# Patient Record
Sex: Female | Born: 1975 | Hispanic: Yes | Marital: Single | Smoking: Never smoker
Health system: Southern US, Community
[De-identification: ages and names within clinical notes are randomized; demographics above are authoritative.]

## PROBLEM LIST (undated history)

## (undated) DIAGNOSIS — K259 Gastric ulcer, unspecified as acute or chronic, without hemorrhage or perforation: Secondary | ICD-10-CM

---

## 2020-03-31 ENCOUNTER — Emergency Department (HOSPITAL_COMMUNITY): Payer: Self-pay

## 2020-03-31 ENCOUNTER — Other Ambulatory Visit: Payer: Self-pay

## 2020-03-31 ENCOUNTER — Encounter (HOSPITAL_COMMUNITY): Payer: Self-pay

## 2020-03-31 ENCOUNTER — Emergency Department (HOSPITAL_COMMUNITY)
Admission: EM | Admit: 2020-03-31 | Discharge: 2020-03-31 | Disposition: A | Discharge status: Home / Self Care | Payer: Self-pay | Attending: Emergency Medicine | Admitting: Emergency Medicine

## 2020-03-31 DIAGNOSIS — N390 Urinary tract infection, site not specified: Secondary | ICD-10-CM | POA: Insufficient documentation

## 2020-03-31 DIAGNOSIS — R1084 Generalized abdominal pain: Secondary | ICD-10-CM

## 2020-03-31 HISTORY — DX: Gastric ulcer, unspecified as acute or chronic, without hemorrhage or perforation: K25.9

## 2020-03-31 LAB — URINALYSIS, ROUTINE W REFLEX MICROSCOPIC
Bilirubin Urine: NEGATIVE
Glucose, UA: NEGATIVE mg/dL
Hgb urine dipstick: NEGATIVE
Ketones, ur: NEGATIVE mg/dL
Nitrite: NEGATIVE
Protein, ur: NEGATIVE mg/dL
Specific Gravity, Urine: 1.006 (ref 1.005–1.030)
pH: 7 (ref 5.0–8.0)

## 2020-03-31 LAB — CBC WITH DIFFERENTIAL/PLATELET
Abs Immature Granulocytes: 0.02 10*3/uL (ref 0.00–0.07)
Basophils Absolute: 0.1 10*3/uL (ref 0.0–0.1)
Basophils Relative: 1 %
Eosinophils Absolute: 1.8 10*3/uL — ABNORMAL HIGH (ref 0.0–0.5)
Eosinophils Relative: 20 %
HCT: 39.8 % (ref 36.0–46.0)
Hemoglobin: 12.5 g/dL (ref 12.0–15.0)
Immature Granulocytes: 0 %
Lymphocytes Relative: 22 %
Lymphs Abs: 1.9 10*3/uL (ref 0.7–4.0)
MCH: 27.8 pg (ref 26.0–34.0)
MCHC: 31.4 g/dL (ref 30.0–36.0)
MCV: 88.6 fL (ref 80.0–100.0)
Monocytes Absolute: 0.3 10*3/uL (ref 0.1–1.0)
Monocytes Relative: 4 %
Neutro Abs: 4.8 10*3/uL (ref 1.7–7.7)
Neutrophils Relative %: 53 %
Platelets: 300 10*3/uL (ref 150–400)
RBC: 4.49 MIL/uL (ref 3.87–5.11)
RDW: 13.7 % (ref 11.5–15.5)
WBC: 8.9 10*3/uL (ref 4.0–10.5)
nRBC: 0 % (ref 0.0–0.2)

## 2020-03-31 LAB — COMPREHENSIVE METABOLIC PANEL
ALT: 20 U/L (ref 0–44)
AST: 21 U/L (ref 15–41)
Albumin: 3.7 g/dL (ref 3.5–5.0)
Alkaline Phosphatase: 80 U/L (ref 38–126)
Anion gap: 10 (ref 5–15)
BUN: 16 mg/dL (ref 6–20)
CO2: 20 mmol/L — ABNORMAL LOW (ref 22–32)
Calcium: 8.9 mg/dL (ref 8.9–10.3)
Chloride: 105 mmol/L (ref 98–111)
Creatinine, Ser: 0.7 mg/dL (ref 0.44–1.00)
GFR, Estimated: >60 mL/min (ref >60)
Glucose, Bld: 89 mg/dL (ref 70–99)
Potassium: 3.9 mmol/L (ref 3.5–5.1)
Sodium: 135 mmol/L (ref 135–145)
Total Bilirubin: 0.4 mg/dL (ref 0.3–1.2)
Total Protein: 7.9 g/dL (ref 6.5–8.1)

## 2020-03-31 LAB — LIPASE, BLOOD: Lipase: 25 U/L (ref 11–51)

## 2020-03-31 LAB — CBG MONITORING, ED: Glucose-Capillary: 75 mg/dL (ref 70–99)

## 2020-03-31 MED ORDER — SODIUM CHLORIDE 0.9 % IV BOLUS
1000.0000 mL | Freq: Once | INTRAVENOUS | Status: AC
  Administered 2020-03-31: 1000 mL via INTRAVENOUS

## 2020-03-31 MED ORDER — MORPHINE SULFATE (PF) 4 MG/ML IV SOLN
4.0000 mg | Freq: Once | INTRAVENOUS | Status: AC
  Administered 2020-03-31: 4 mg via INTRAVENOUS
  Filled 2020-03-31: qty 1

## 2020-03-31 MED ORDER — ONDANSETRON HCL 4 MG/2ML IJ SOLN
4.0000 mg | Freq: Once | INTRAMUSCULAR | Status: AC
  Administered 2020-03-31: 4 mg via INTRAVENOUS
  Filled 2020-03-31: qty 2

## 2020-03-31 MED ORDER — IOHEXOL 300 MG/ML  SOLN
100.0000 mL | Freq: Once | INTRAMUSCULAR | Status: AC | PRN
  Administered 2020-03-31: 100 mL via INTRAVENOUS

## 2020-03-31 MED ORDER — CEPHALEXIN 500 MG PO CAPS: 500.0000 mg | ORAL_CAPSULE | Freq: Two times a day (BID) | ORAL | 0 refills | Status: AC

## 2020-03-31 NOTE — ED Triage Notes (Signed)
Pt presents to ED via POV for abdominal pain x 3 days. Pt c/o generalized abdominal pain, vomiting and nausea. Pt denies diarrhea.

## 2020-03-31 NOTE — ED Provider Notes (Signed)
[facility] EMERGENCY DEPARTMENT Provider Note   CSN: [id number] Arrival date & time: 03/31/20  4098     History Chief Complaint  Patient presents with  . Abdominal Pain    Lori Hart is a 45 y.o. female.  45 year old female with history of ulcer disease presents with complaint of abdominal pain x 3 days with nausea and vomiting. Denies blood in emesis or stools, denies changes in bowel or bladder habits, NSAID use, is a non smoker, does not drink alcohol. No prior abdominal surgeries. Reports taking unknown medication for her ulcers, does not take any other medications.  A language interpreter was used (spanish).       Past Medical History:  Diagnosis Date  . Stomach ulcer     There are no problems to display for this patient.   History reviewed. No pertinent surgical history.   OB History   No obstetric history on file.     No family history on file.  Social History   Tobacco Use  . Smoking status: Never Smoker  . Smokeless tobacco: Never Used  Substance Use Topics  . Alcohol use: Not Currently  . Drug use: Not Currently    Home Medications Prior to Admission medications   Medication Sig Start Date End Date Taking? Authorizing Provider  cephALEXin (KEFLEX) 500 MG capsule Take 1 capsule (500 mg total) by mouth 2 (two) times daily for 5 days. 03/31/20 04/05/20 Yes Jeannie Fend, PA-C    Allergies    Patient has no known allergies.  Review of Systems   Review of Systems  Constitutional: Negative for chills and fever.  Respiratory: Negative for shortness of breath.   Cardiovascular: Negative for chest pain.  Gastrointestinal: Positive for abdominal pain, nausea and vomiting. Negative for blood in stool and diarrhea.  Genitourinary: Negative for dysuria.  Musculoskeletal: Negative for arthralgias and myalgias.  Skin: Negative for wound.  Allergic/Immunologic: Negative for immunocompromised state.  Neurological: Negative for weakness.  All other  systems reviewed and are negative.   Physical Exam Updated Vital Signs BP 109/73   Pulse (!) 59   Temp 98.3 F (36.8 C) (Oral)   Resp 15   Ht 5' (1.524 m)   Wt 63.5 kg   SpO2 100%   BMI 27.34 kg/m   Physical Exam Vitals and nursing note reviewed.  Constitutional:      General: She is not in acute distress.    Appearance: She is well-developed and well-nourished. She is obese. She is not diaphoretic.     Comments: Appears uncomfortable   HENT:     Head: Normocephalic and atraumatic.  Cardiovascular:     Rate and Rhythm: Normal rate and regular rhythm.     Heart sounds: Normal heart sounds.  Pulmonary:     Effort: Pulmonary effort is normal.     Breath sounds: Normal breath sounds.  Abdominal:     Palpations: Abdomen is soft.     Tenderness: There is generalized abdominal tenderness.  Skin:    General: Skin is warm and dry.     Findings: No rash.  Neurological:     Mental Status: She is alert and oriented to person, place, and time.  Psychiatric:        Mood and Affect: Mood and affect normal.        Behavior: Behavior normal.     ED Results / Procedures / Treatments   Labs (all labs ordered are listed, but only abnormal results are displayed) Labs  Reviewed  CBC WITH DIFFERENTIAL/PLATELET - Abnormal; Notable for the following components:      Result Value   Eosinophils Absolute 1.8 (*)    All other components within normal limits  COMPREHENSIVE METABOLIC PANEL - Abnormal; Notable for the following components:   CO2 20 (*)    All other components within normal limits  URINALYSIS, ROUTINE W REFLEX MICROSCOPIC - Abnormal; Notable for the following components:   Color, Urine STRAW (*)    Leukocytes,Ua MODERATE (*)    Bacteria, UA RARE (*)    All other components within normal limits  LIPASE, BLOOD  CBG MONITORING, ED  I-STAT BETA HCG BLOOD, ED ([facility], [facility], [facility] ONLY)    EKG EKG Interpretation  Date/Time:  Wednesday March 31 2020 09:55:55 EST Ventricular  Rate:  61 PR Interval:    QRS Duration: 93 QT Interval:  440 QTC Calculation: 444 R Axis:   67 Text Interpretation: Sinus rhythm normal rate, No ST elevations Confirmed by Norman Clay ([id number]) on 03/31/2020 10:05:50 AM   Radiology CT Abdomen Pelvis W Contrast  Result Date: 03/31/2020 CLINICAL DATA:  Abdominal pain EXAM: CT ABDOMEN AND PELVIS WITH CONTRAST TECHNIQUE: Multidetector CT imaging of the abdomen and pelvis was performed using the standard protocol following bolus administration of intravenous contrast. CONTRAST:  OMNIPAQUE IOHEXOL 300 MG/ML  SOLN COMPARISON:  None. FINDINGS: Lower chest: Calcified granuloma in the left lung base posteriorly. No acute abnormality. Hepatobiliary: No focal hepatic abnormality. Gallbladder unremarkable. Pancreas: No focal abnormality or ductal dilatation. Spleen: No focal abnormality.  Normal size. Adrenals/Urinary Tract: Multiple right lower pole renal stones noted. The largest measures up to 2.5 cm extending from a right lower pole calyx into the right renal pelvis. Right upper pole parapelvic cysts. Cortical atrophy and cortical thinning. No hydronephrosis. No stones or hydronephrosis on the left. Adrenal glands and urinary bladder unremarkable. Stomach/Bowel: Stomach, large and small bowel grossly unremarkable. Appendix not definitively seen. No inflammatory process in the right lower quadrant. Vascular/Lymphatic: No evidence of aneurysm or adenopathy. Reproductive: Uterus and left ovary unremarkable. 3.6 cm cyst in the right ovary. Other: No free fluid or free air. Musculoskeletal: No acute bony abnormality. IMPRESSION: Right lower pole renal stones measuring up to 2.5 cm. Cortical thinning and scarring with atrophy throughout the right kidney. No hydronephrosis. 3.6 cm right ovarian cyst, likely functional cyst. No acute findings. Electronically Signed   By: Charlett Nose M.D.   On: 03/31/2020 12:39   US Abdomen Limited  Result Date:  03/31/2020 CLINICAL DATA:  Right upper quadrant pain. EXAM: ULTRASOUND ABDOMEN LIMITED RIGHT UPPER QUADRANT COMPARISON:  CT 03/31/2020. FINDINGS: Gallbladder: No gallstones or wall thickening visualized. No sonographic Chenae Brager sign noted by sonographer. Common bile duct: Diameter: 3.3 mm Liver: No focal lesion identified. Within normal limits in parenchymal echogenicity. Portal vein is patent on color Doppler imaging with normal direction of blood flow towards the liver. Other: Incidental note is made of a 2.8 cm simple appearing cyst in the right kidney. 1.5 cm nonobstructing right renal stone also incidentally noted. IMPRESSION: 1. No gallstones or biliary distention. 2. Incidental note is made of a 2.8 cm simple appearing cyst in the right kidney. 1.5 cm nonobstructing right renal stone also incidentally noted. Electronically Signed   By: Maisie Fus  Register   On: 03/31/2020 14:38    Procedures Procedures   Medications Ordered in ED Medications  ondansetron [facility]) injection 4 mg (4 mg Intravenous Given 03/31/20 1033)  sodium chloride 0.9 % bolus 1,000 mL (0  mLs Intravenous Stopped 03/31/20 1149)  morphine 4 MG/ML injection 4 mg (4 mg Intravenous Given 03/31/20 1034)  iohexol (OMNIPAQUE) 300 MG/ML solution 100 mL (100 mLs Intravenous Contrast Given 03/31/20 1209)    ED Course  I have reviewed the triage vital signs and the nursing notes.  Pertinent labs & imaging results that were available during my care of the patient were reviewed by me and considered in my medical decision making (see chart for details).  Clinical Course as of 03/31/20 1536  Wed Mar 31, 2020  1321 I was directly involved in this patients medical care.  [JH]  1533 44yo female with abdominal pain as above.  On exam, found to have generalized tenderness.  CBC unremarkable, CMP without significant findings. Lipase WNL. UA with moderate leukocytes with 6-10 WBC and rare bacteria.  Plan was for abx for UTI. Patient was seen by  Dr. Audley Hose, ER attending, add on US gallbladder for RUQ tenderness on his exam. Gallbladder US with normal gallbladder. Patient dc with Keflex, recommend recheck with PCP, return to ER as needed.  [LM]    Clinical Course User Index [JH] Audley Hose Eustace Moore, MD [LM] Alden Hipp   MDM Rules/Calculators/A&P                          Final Clinical Impression(s) / ED Diagnoses Final diagnoses:  Generalized abdominal pain  Urinary tract infection in female    Rx / DC Orders ED Discharge Orders         Ordered    cephALEXin (KEFLEX) 500 MG capsule  2 times daily        03/31/20 1311           Alden Hipp 03/31/20 1536    Cheryll Cockayne, MD 04/03/20 1454

## 2020-03-31 NOTE — Discharge Instructions (Addendum)
Take your antibiotic as prescribed and complete the full course.  Recheck with your doctor if pain continues. Return to the ER for worsening or concerning symptoms.    Tome su antibitico segn lo prescrito y complete el curso completo.  Vuelva a consultar con su mdico si el dolor contina. Regrese a la sala de emergencias por empeoramiento o sntomas preocupantes.

## 2020-03-31 NOTE — ED Notes (Signed)
Pt lying in bed at this time in no distress. Equal rise and fall of chest. Vss. Call bell in reach. Bed lowered and locked with side rails up x2. Will continue to monitor patient.

## 2020-04-01 LAB — I-STAT BETA HCG BLOOD, ED (MC, WL, AP ONLY): I-stat hCG, quantitative: <5 m[IU]/mL (ref <5)

## 2022-05-04 IMAGING — US US ABDOMEN LIMITED RUQ/ASCITES
1 series · 14 of 25 positions shown · non-contrast
Comparison: CT 03/31/2020.

CLINICAL DATA: Right upper quadrant pain.

EXAM:
ULTRASOUND ABDOMEN LIMITED RIGHT UPPER QUADRANT

[Series 1: us abdomen limited · 14 of 90 slices shown]
[im 1/90]
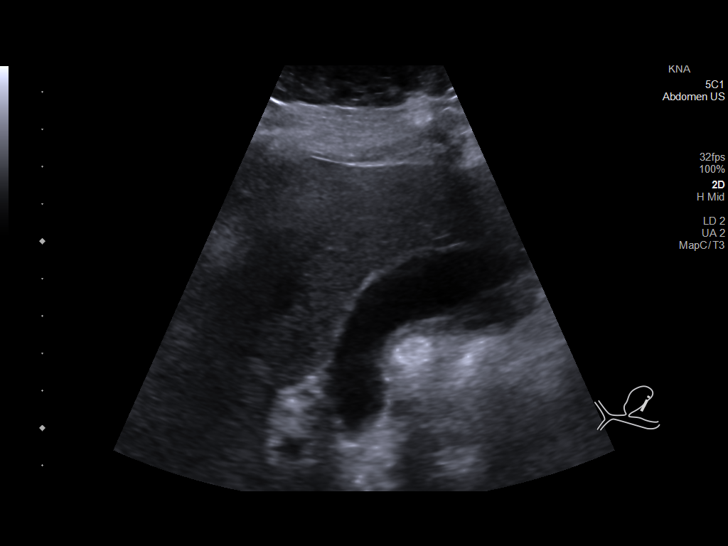
[im 8/90]
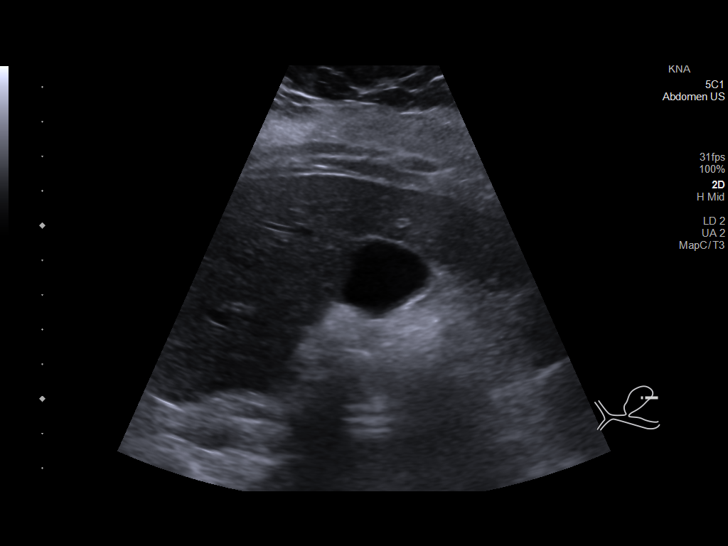
[im 15/90]
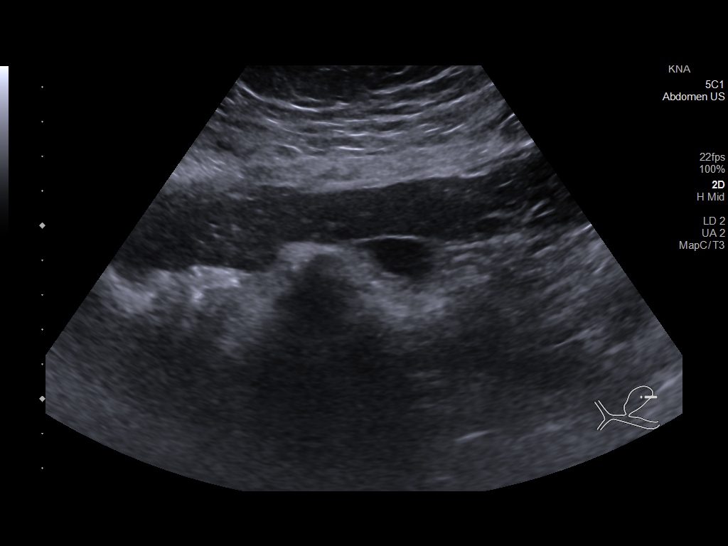
[im 23/90]
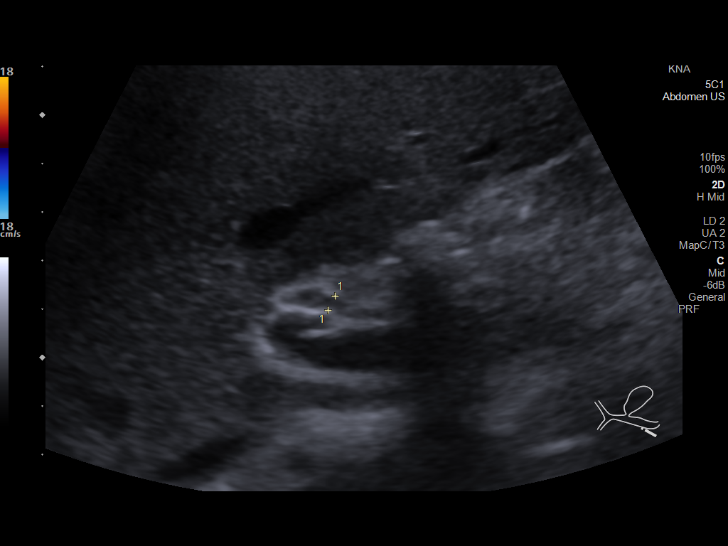
[im 30/90]
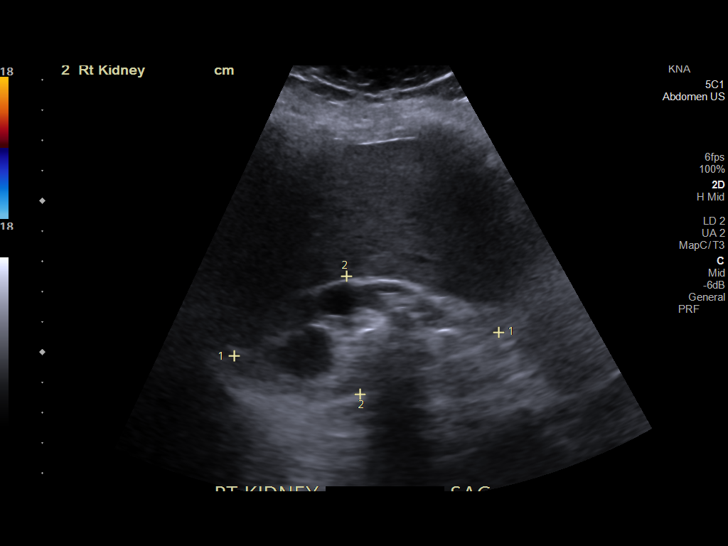
[im 34/90]
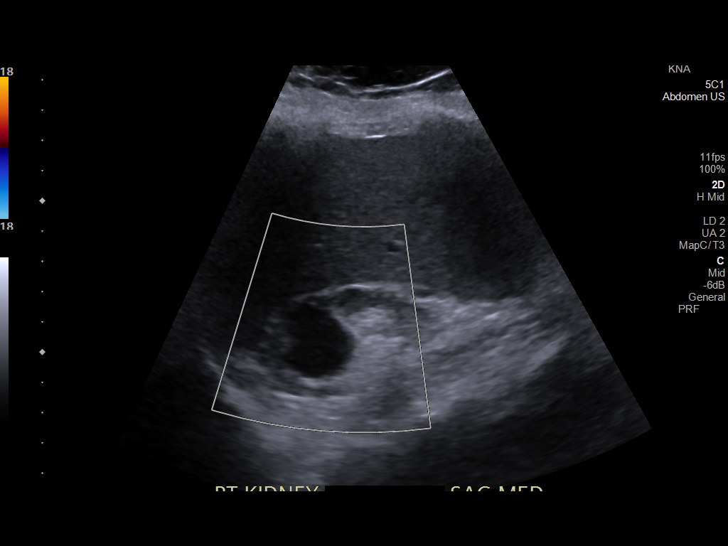
[im 41/90]
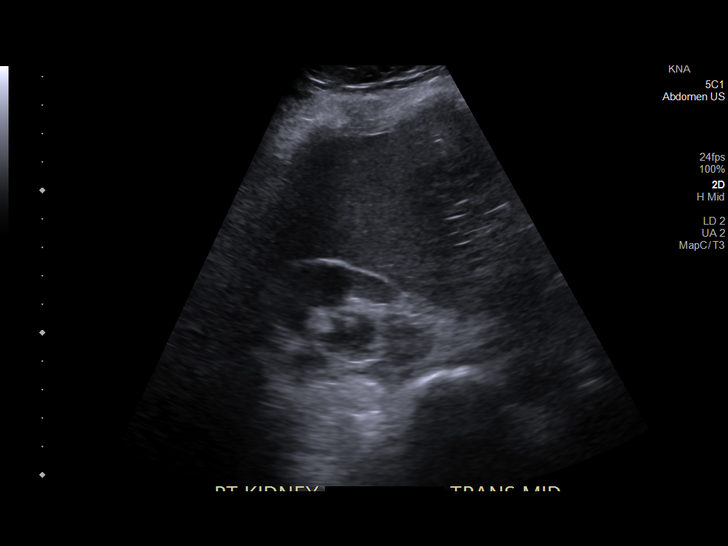
[im 49/90]
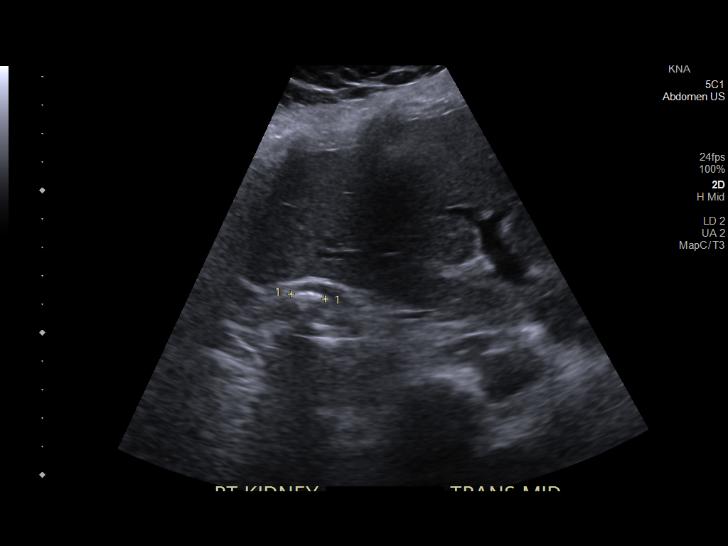
[im 56/90]
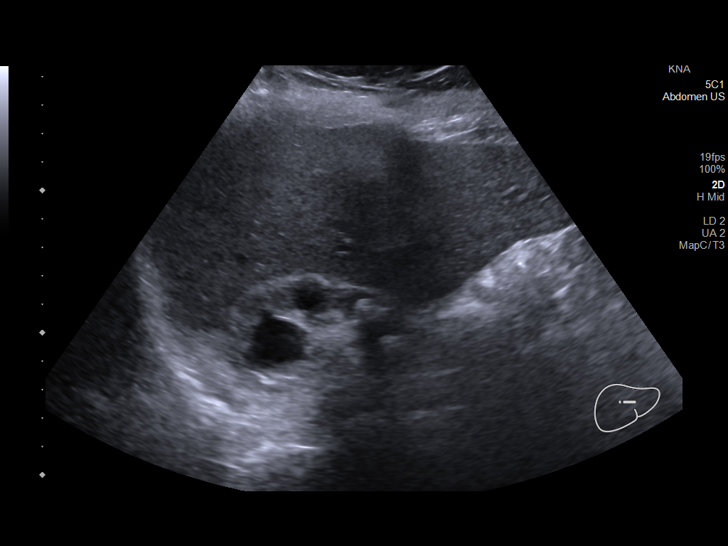
[im 60/90]
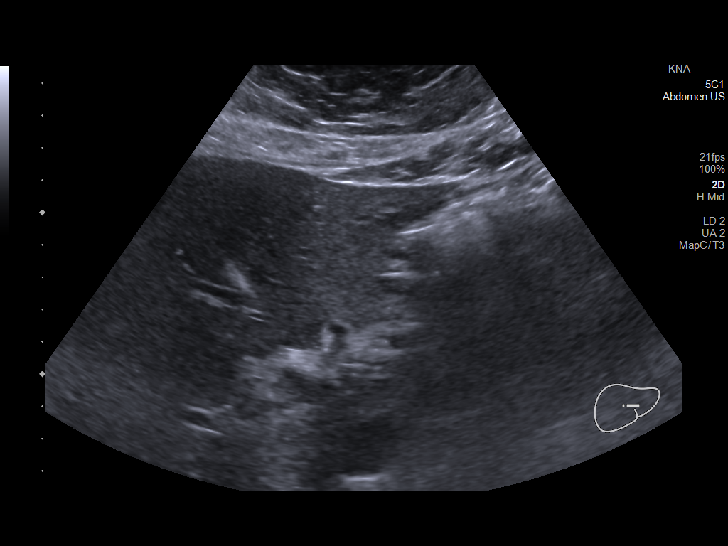
[im 67/90]
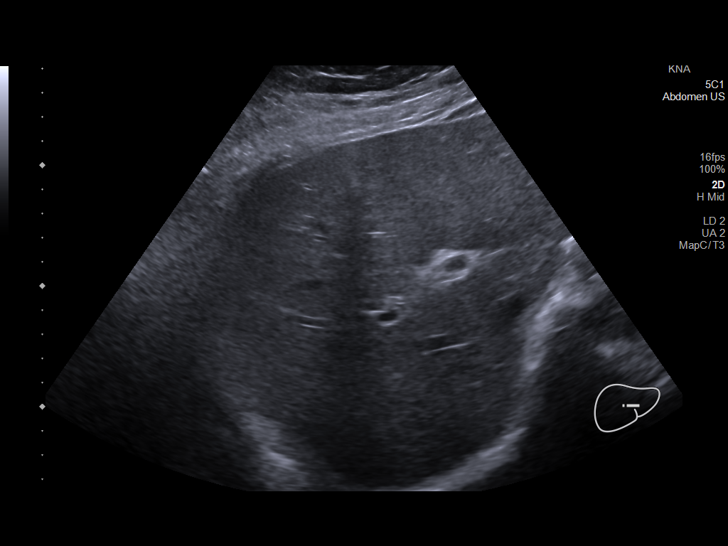
[im 75/90]
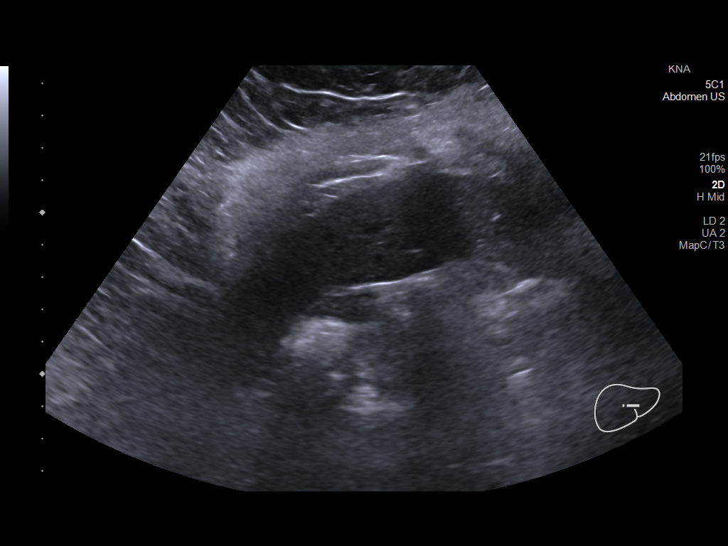
[im 82/90]
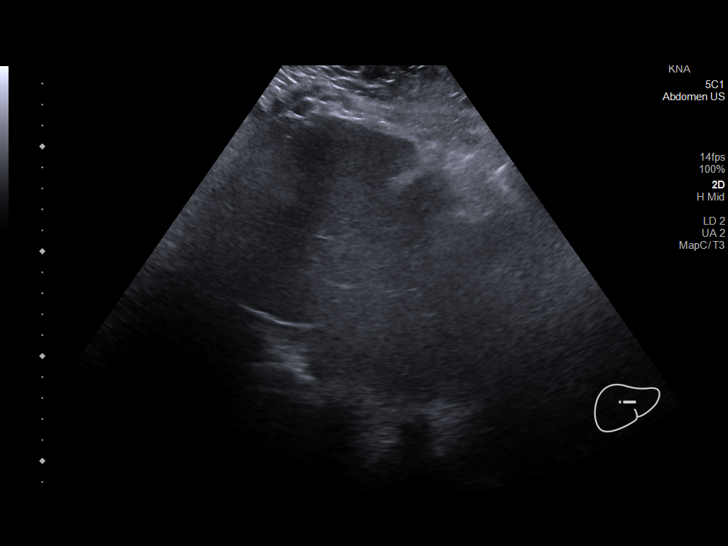
[im 90/90]
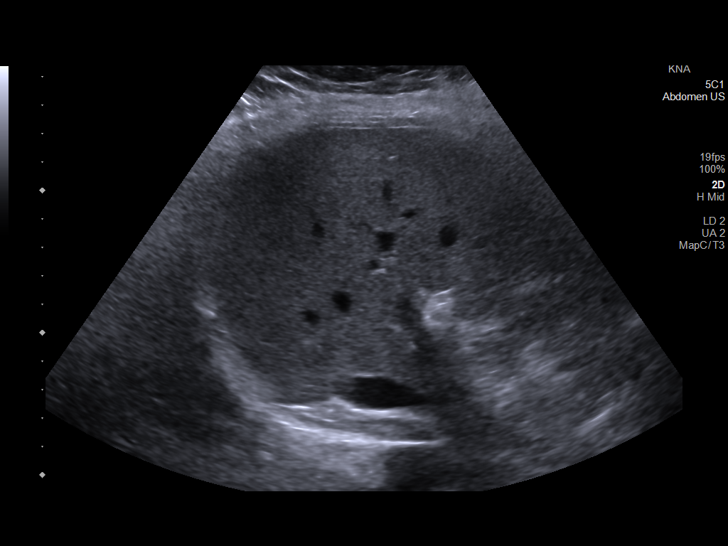

[14 of 25 positions shown; findings below may reference images not displayed]

FINDINGS: Gallbladder:

No gallstones or wall thickening visualized. No sonographic Murphy
sign noted by sonographer.

Common bile duct:

Diameter: 3.3 mm

Liver:

No focal lesion identified. Within normal limits in parenchymal
echogenicity. Portal vein is patent on color Doppler imaging with
normal direction of blood flow towards the liver.

Other: Incidental note is made of a 2.8 cm simple appearing cyst in
the right kidney. 1.5 cm nonobstructing right renal stone also
incidentally noted.
IMPRESSION: 1. No gallstones or biliary distention.
2. Incidental note is made of a 2.8 cm simple appearing cyst in the
right kidney. 1.5 cm nonobstructing right renal stone also
incidentally noted.

## 2022-05-04 IMAGING — CT CT ABD-PELV W/ CM
2 of 5 series · 16 of 46 positions shown, 18 images · IV contrast (Omnipaque or Isovue)
Comparison: None.

CLINICAL DATA: Abdominal pain

EXAM:
CT ABDOMEN AND PELVIS WITH CONTRAST
TECHNIQUE: Multidetector CT imaging of the abdomen and pelvis was performed
using the standard protocol following bolus administration of
intravenous contrast.
CONTRAST:  100mL OMNIPAQUE IOHEXOL 300 MG/ML  SOLN

[Series 2: axial st · axial · 0.79mm/px · z∈[+532,+912]mm · 13 of 86 slices shown, 15 images]
[im 5/86  soft-tissue]
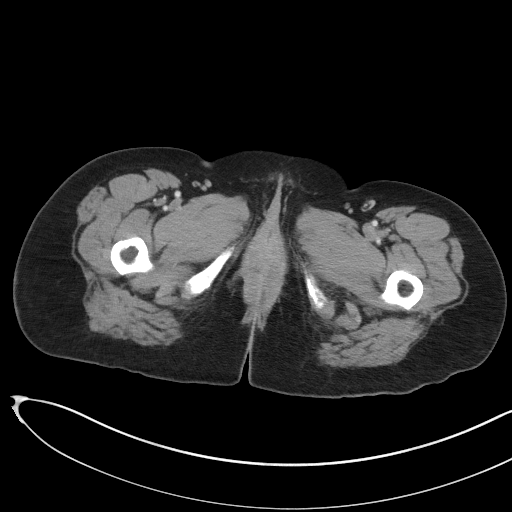
[im 5/86  bone]
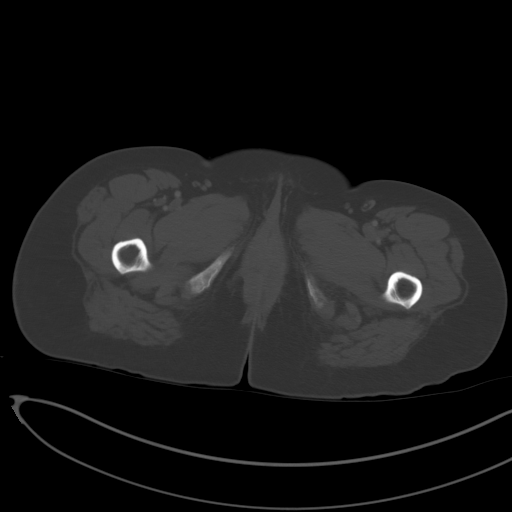
[im 13/86  soft-tissue]
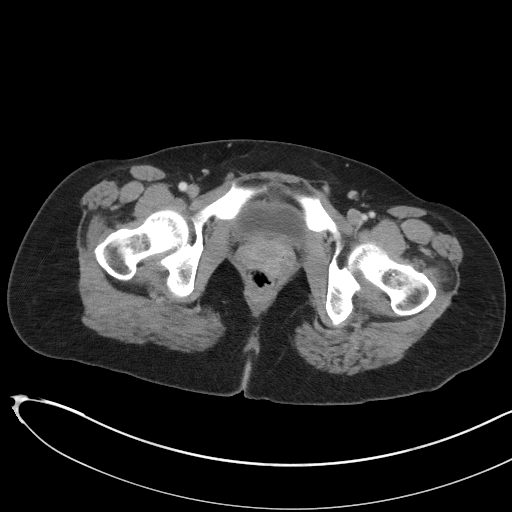
[im 18/86  soft-tissue]
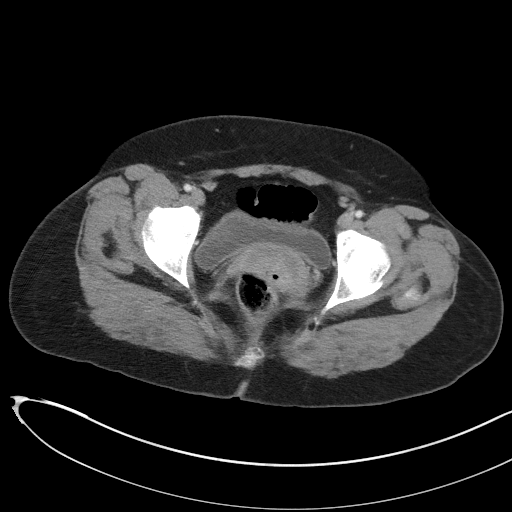
[im 26/86  soft-tissue]
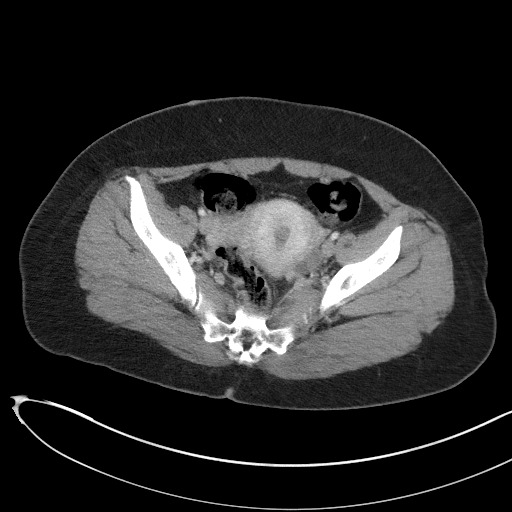
[im 30/86  soft-tissue]
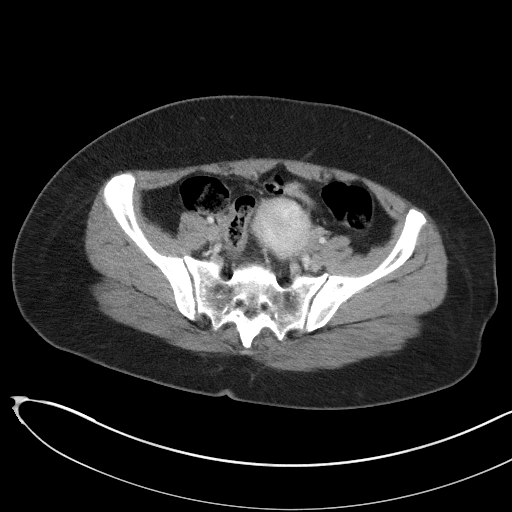
[im 39/86  soft-tissue]
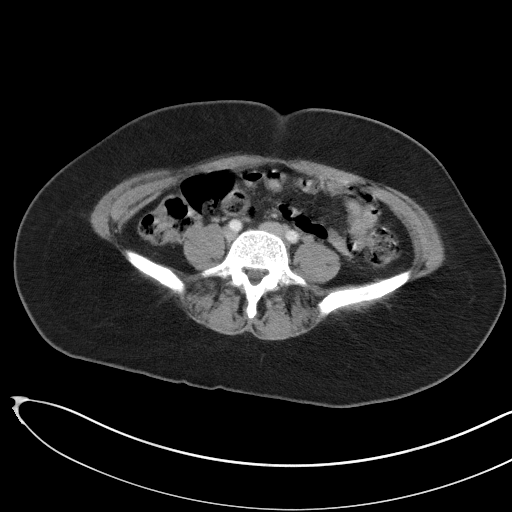
[im 43/86  soft-tissue]
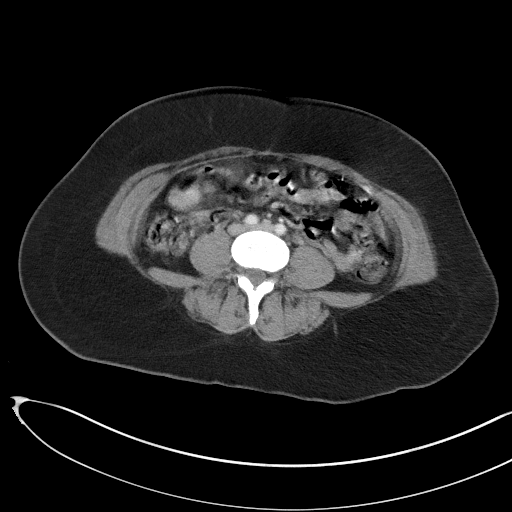
[im 47/86  soft-tissue]
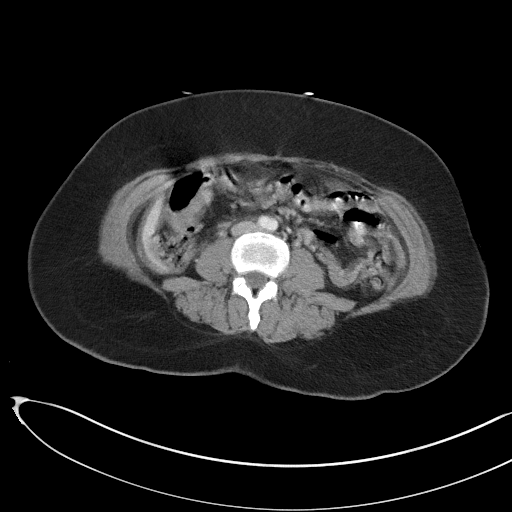
[im 56/86  soft-tissue]
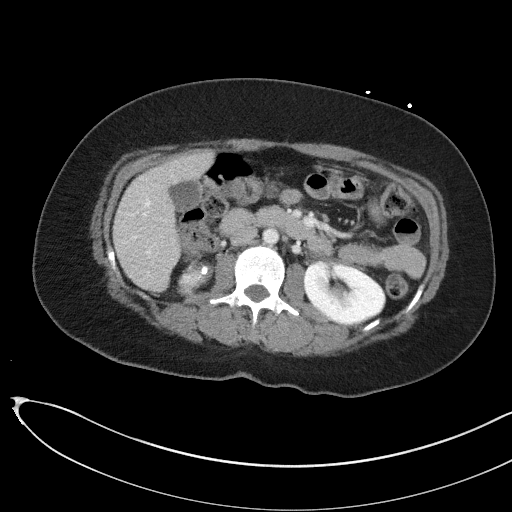
[im 56/86  bone]
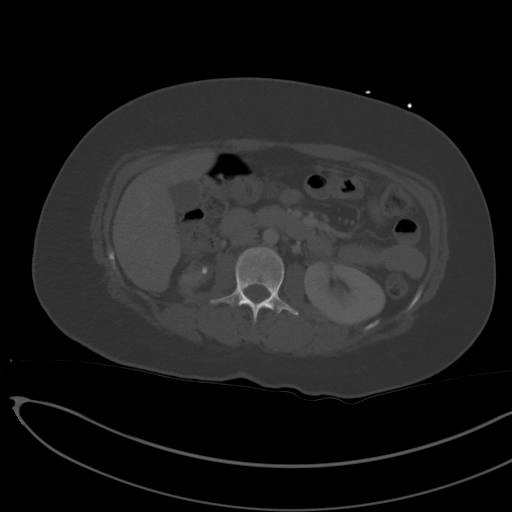
[im 60/86  soft-tissue]
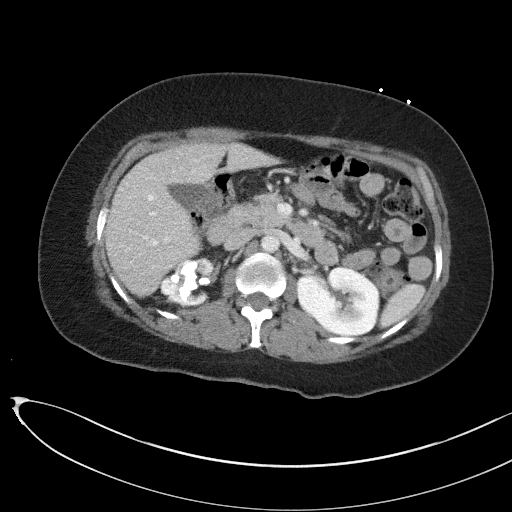
[im 69/86  soft-tissue]
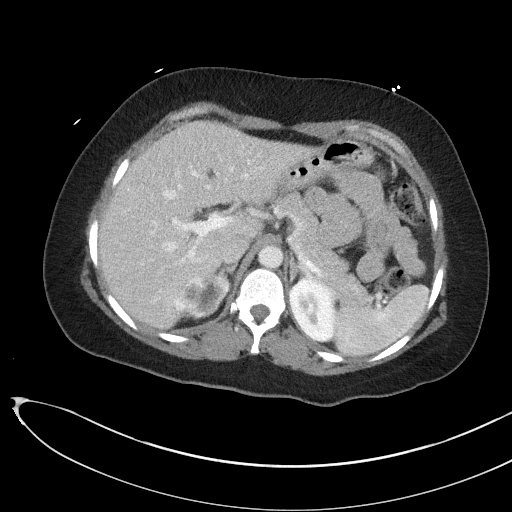
[im 73/86  soft-tissue]
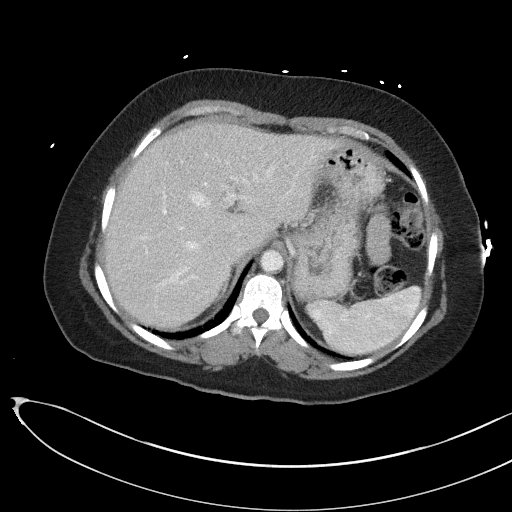
[im 81/86  soft-tissue]
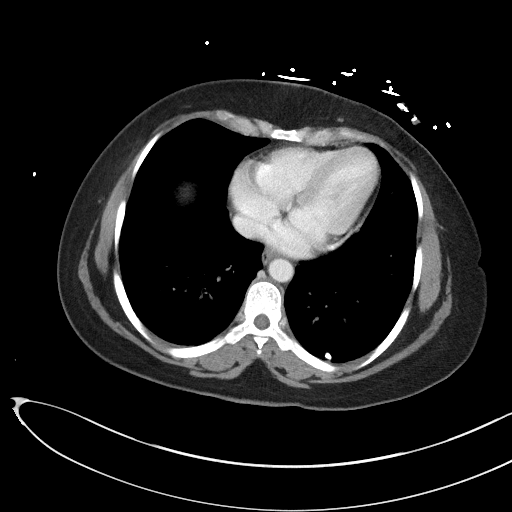

[Series 5: coronal st · coronal · 0.75mm/px · 3 of 95 slices shown]
[im 32/95  soft-tissue]
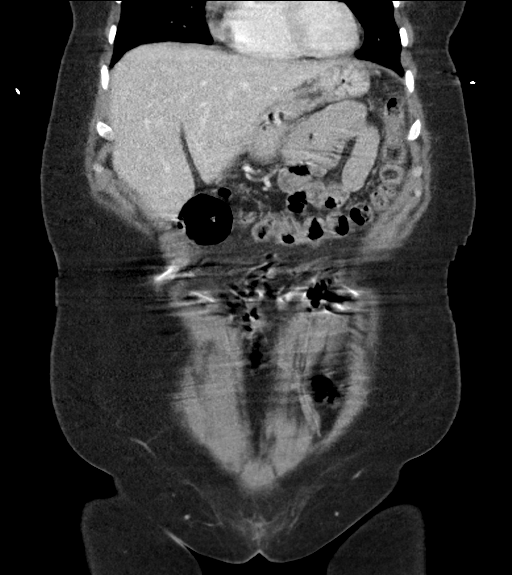
[im 42/95  soft-tissue]
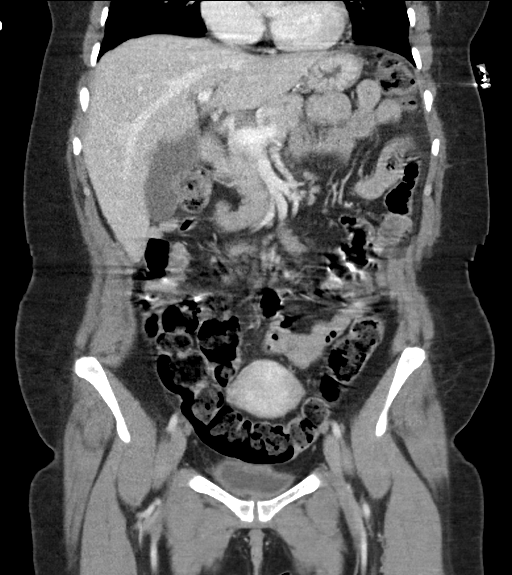
[im 53/95  soft-tissue]
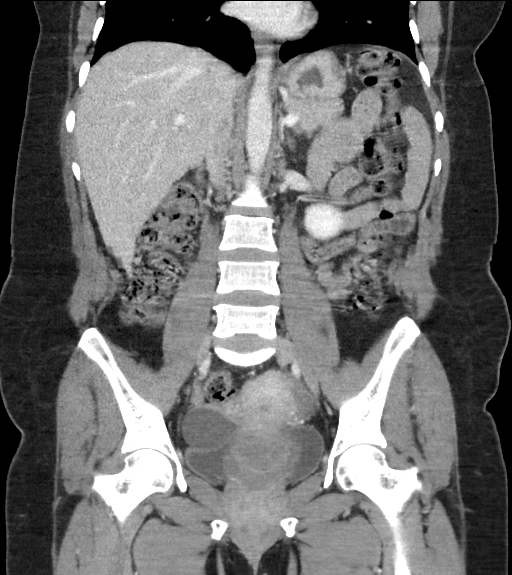

[16 of 46 positions shown; findings below may reference images not displayed]

FINDINGS: Lower chest: Calcified granuloma in the left lung base posteriorly.
No acute abnormality.

Hepatobiliary: No focal hepatic abnormality. Gallbladder
unremarkable.

Pancreas: No focal abnormality or ductal dilatation.

Spleen: No focal abnormality.  Normal size.

Adrenals/Urinary Tract: Multiple right lower pole renal stones
noted. The largest measures up to 2.5 cm extending from a right
lower pole calyx into the right renal pelvis. Right upper pole
parapelvic cysts. Cortical atrophy and cortical thinning. No
hydronephrosis. No stones or hydronephrosis on the left. Adrenal
glands and urinary bladder unremarkable.

Stomach/Bowel: Stomach, large and small bowel grossly unremarkable.
Appendix not definitively seen. No inflammatory process in the right
lower quadrant.

Vascular/Lymphatic: No evidence of aneurysm or adenopathy.

Reproductive: Uterus and left ovary unremarkable. 3.6 cm cyst in the
right ovary.

Other: No free fluid or free air.

Musculoskeletal: No acute bony abnormality.
IMPRESSION: Right lower pole renal stones measuring up to 2.5 cm. Cortical
thinning and scarring with atrophy throughout the right kidney. No
hydronephrosis.

3.6 cm right ovarian cyst, likely functional cyst.

No acute findings.
